# Patient Record
Sex: Female | Born: 2000 | Race: White | Hispanic: No | Marital: Single | Smoking: Never smoker
Health system: Southern US, Community
[De-identification: ages and names within clinical notes are randomized; demographics above are authoritative.]

## PROBLEM LIST (undated history)

## (undated) DIAGNOSIS — N83209 Unspecified ovarian cyst, unspecified side: Secondary | ICD-10-CM

## (undated) DIAGNOSIS — F32A Depression, unspecified: Secondary | ICD-10-CM

## (undated) DIAGNOSIS — F419 Anxiety disorder, unspecified: Secondary | ICD-10-CM

## (undated) DIAGNOSIS — I4891 Unspecified atrial fibrillation: Secondary | ICD-10-CM

## (undated) HISTORY — PX: WISDOM TOOTH EXTRACTION: SHX21

## (undated) HISTORY — DX: Depression, unspecified: F32.A

## (undated) HISTORY — DX: Anxiety disorder, unspecified: F41.9

## (undated) HISTORY — PX: TONSILLECTOMY: SUR1361

## (undated) HISTORY — PX: KNEE SURGERY: SHX244

## (undated) HISTORY — DX: Unspecified ovarian cyst, unspecified side: N83.209

## (undated) HISTORY — PX: OTHER SURGICAL HISTORY: SHX169

## (undated) HISTORY — DX: Unspecified atrial fibrillation: I48.91

## (undated) HISTORY — PX: CHOLECYSTECTOMY, LAPAROSCOPIC: SHX56

---
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
## 2019-01-27 ENCOUNTER — Other Ambulatory Visit (HOSPITAL_COMMUNITY): Payer: Self-pay | Admitting: General Surgery

## 2019-01-27 ENCOUNTER — Other Ambulatory Visit: Payer: Self-pay | Admitting: General Surgery

## 2019-01-27 DIAGNOSIS — R109 Unspecified abdominal pain: Secondary | ICD-10-CM

## 2019-01-27 DIAGNOSIS — R112 Nausea with vomiting, unspecified: Secondary | ICD-10-CM

## 2019-02-01 ENCOUNTER — Other Ambulatory Visit: Payer: Self-pay

## 2019-02-01 ENCOUNTER — Encounter (HOSPITAL_COMMUNITY)
Admission: RE | Admit: 2019-02-01 | Discharge: 2019-02-01 | Disposition: A | Discharge status: Home / Self Care | Payer: Commercial Managed Care - PPO | Source: Ambulatory Visit | Attending: General Surgery | Admitting: General Surgery

## 2019-02-01 DIAGNOSIS — R112 Nausea with vomiting, unspecified: Secondary | ICD-10-CM | POA: Diagnosis not present

## 2019-02-01 MED ORDER — TECHNETIUM TC 99M MEBROFENIN IV KIT
5.3000 | PACK | Freq: Once | INTRAVENOUS | Status: AC | PRN
  Administered 2019-02-01: 5.3 via INTRAVENOUS

## 2019-02-03 ENCOUNTER — Other Ambulatory Visit: Payer: Self-pay

## 2019-02-03 ENCOUNTER — Encounter (HOSPITAL_COMMUNITY): Payer: Self-pay

## 2019-02-03 ENCOUNTER — Encounter (HOSPITAL_COMMUNITY)
Admission: RE | Admit: 2019-02-03 | Discharge: 2019-02-03 | Disposition: A | Discharge status: Home / Self Care | Payer: Commercial Managed Care - PPO | Source: Ambulatory Visit | Attending: General Surgery | Admitting: General Surgery

## 2019-02-03 DIAGNOSIS — R109 Unspecified abdominal pain: Secondary | ICD-10-CM | POA: Insufficient documentation

## 2019-02-03 DIAGNOSIS — R112 Nausea with vomiting, unspecified: Secondary | ICD-10-CM | POA: Insufficient documentation

## 2019-02-03 MED ORDER — TECHNETIUM TC 99M SULFUR COLLOID
2.2000 | Freq: Once | INTRAVENOUS | Status: AC | PRN
  Administered 2019-02-03: 2.2 via ORAL

## 2019-02-05 ENCOUNTER — Other Ambulatory Visit: Payer: Self-pay

## 2019-02-05 ENCOUNTER — Encounter (HOSPITAL_COMMUNITY)
Admission: RE | Admit: 2019-02-05 | Discharge: 2019-02-05 | Disposition: A | Discharge status: Home / Self Care | Payer: Commercial Managed Care - PPO | Source: Ambulatory Visit | Attending: General Surgery | Admitting: General Surgery

## 2019-02-05 DIAGNOSIS — R109 Unspecified abdominal pain: Secondary | ICD-10-CM | POA: Diagnosis not present

## 2019-02-05 DIAGNOSIS — R112 Nausea with vomiting, unspecified: Secondary | ICD-10-CM | POA: Diagnosis present

## 2019-02-05 MED ORDER — TECHNETIUM TC 99M SULFUR COLLOID
2.0000 | Freq: Once | INTRAVENOUS | Status: AC | PRN
  Administered 2019-02-05: 08:00:00 2 via INTRAVENOUS

## 2019-03-12 DIAGNOSIS — K829 Disease of gallbladder, unspecified: Secondary | ICD-10-CM | POA: Insufficient documentation

## 2021-04-10 DIAGNOSIS — T8484XA Pain due to internal orthopedic prosthetic devices, implants and grafts, initial encounter: Secondary | ICD-10-CM | POA: Insufficient documentation

## 2021-06-18 DIAGNOSIS — I4891 Unspecified atrial fibrillation: Secondary | ICD-10-CM | POA: Insufficient documentation

## 2021-09-17 ENCOUNTER — Encounter: Payer: Commercial Managed Care - PPO | Admitting: Obstetrics & Gynecology

## 2021-10-04 ENCOUNTER — Encounter: Payer: Self-pay | Admitting: Obstetrics and Gynecology

## 2021-10-08 ENCOUNTER — Encounter: Payer: Self-pay | Admitting: Obstetrics & Gynecology

## 2021-10-08 ENCOUNTER — Ambulatory Visit (INDEPENDENT_AMBULATORY_CARE_PROVIDER_SITE_OTHER): Payer: Managed Care, Other (non HMO) | Admitting: Obstetrics & Gynecology

## 2021-10-08 ENCOUNTER — Other Ambulatory Visit (HOSPITAL_COMMUNITY)
Admission: RE | Admit: 2021-10-08 | Discharge: 2021-10-08 | Disposition: A | Discharge status: Home / Self Care | Payer: Managed Care, Other (non HMO) | Source: Ambulatory Visit | Attending: Obstetrics & Gynecology | Admitting: Obstetrics & Gynecology

## 2021-10-08 ENCOUNTER — Other Ambulatory Visit: Payer: Self-pay

## 2021-10-08 VITALS — BP 118/68 | HR 74 | Ht 68.0 in | Wt 136.0 lb

## 2021-10-08 DIAGNOSIS — N938 Other specified abnormal uterine and vaginal bleeding: Secondary | ICD-10-CM | POA: Diagnosis not present

## 2021-10-08 DIAGNOSIS — N898 Other specified noninflammatory disorders of vagina: Secondary | ICD-10-CM | POA: Insufficient documentation

## 2021-10-08 DIAGNOSIS — M419 Scoliosis, unspecified: Secondary | ICD-10-CM | POA: Insufficient documentation

## 2021-10-08 DIAGNOSIS — Z3009 Encounter for other general counseling and advice on contraception: Secondary | ICD-10-CM

## 2021-10-08 DIAGNOSIS — T384X5A Adverse effect of oral contraceptives, initial encounter: Secondary | ICD-10-CM

## 2021-10-08 NOTE — Progress Notes (Signed)
   Subjective:    Patient ID: Autumn Howell, female    DOB: [birth year], 20 y.o.   MRN: [id number]  HPI  20 yo female presents c/o 5-6 months vaginal discharge--sour smelling Stopped OCPs in November prior to breast surgery.  Pt was spotting all time time and stopped.  Not sexually right now.  Pt has history of ovarian cysts and had been on a couple of years (maybe generic sprintec).    Review of Systems  Constitutional: Negative.   Respiratory: Negative.    Cardiovascular: Negative.   Gastrointestinal: Negative.   Genitourinary:  Positive for menstrual problem, vaginal bleeding and vaginal discharge. Negative for pelvic pain and vaginal pain.  Musculoskeletal: Negative.       Objective:   Physical Exam Vitals reviewed.  Constitutional:      General: She is not in acute distress.    Appearance: She is well-developed.  HENT:     Head: Normocephalic and atraumatic.  Eyes:     Conjunctiva/sclera: Conjunctivae normal.  Cardiovascular:     Rate and Rhythm: Normal rate.  Pulmonary:     Effort: Pulmonary effort is normal.  Genitourinary:    Comments: Tanner V Vulva:  No lesion Vagina:  Pink, no lesions, white discharge, no blood Cervix:  No CMT Uterus:  Non tender, mobile Right adnexa--non tender, no mass Left adnexa--non tender, no mass Skin:    General: Skin is warm and dry.  Neurological:     Mental Status: She is alert and oriented to person, place, and time.  Psychiatric:        Mood and Affect: Mood normal.   Vitals:   10/08/21 1324  BP: 118/68  Pulse: 74  Weight: 136 lb (61.7 kg)  Height: 5\' 8"  (1.727 m)    Assessment & Plan:  20 yo female with vaginal discharge and history of bleeding on OCPs   GC/Chlam and vaginitis screening Declines serum testing Discussed birth control options, need for OCPs for diagnosis or recurrent ovarian cysts, unintended pregnancy. Needs appt for well woman exam  30 minutes spent during encounter including review of outside  records, history, physical exam, counseling and documentation.

## 2021-10-09 LAB — CERVICOVAGINAL ANCILLARY ONLY
Bacterial Vaginitis (gardnerella): POSITIVE — AB
Candida Glabrata: NEGATIVE
Candida Vaginitis: NEGATIVE
Chlamydia: NEGATIVE
Comment: NEGATIVE
Comment: NEGATIVE
Comment: NEGATIVE
Comment: NEGATIVE
Comment: NEGATIVE
Comment: NORMAL
Neisseria Gonorrhea: NEGATIVE
Trichomonas: NEGATIVE

## 2021-10-11 ENCOUNTER — Other Ambulatory Visit: Payer: Self-pay | Admitting: Obstetrics & Gynecology

## 2021-10-11 MED ORDER — METRONIDAZOLE 500 MG PO TABS: 500.0000 mg | ORAL_TABLET | Freq: Two times a day (BID) | ORAL | 0 refills | Status: AC

## 2021-10-11 NOTE — Progress Notes (Signed)
Flagyl prescribed for BV 

## 2021-10-24 ENCOUNTER — Encounter: Payer: Self-pay | Admitting: *Deleted

## 2022-01-02 ENCOUNTER — Telehealth: Payer: Self-pay | Admitting: *Deleted

## 2022-01-02 NOTE — Telephone Encounter (Signed)
Left patient a message to call the office to schedule annual. ?

## 2022-04-18 ENCOUNTER — Emergency Department (HOSPITAL_BASED_OUTPATIENT_CLINIC_OR_DEPARTMENT_OTHER): Payer: Managed Care, Other (non HMO)

## 2022-04-18 ENCOUNTER — Encounter (HOSPITAL_BASED_OUTPATIENT_CLINIC_OR_DEPARTMENT_OTHER): Payer: Self-pay | Admitting: Emergency Medicine

## 2022-04-18 ENCOUNTER — Other Ambulatory Visit: Payer: Self-pay

## 2022-04-18 ENCOUNTER — Emergency Department (HOSPITAL_BASED_OUTPATIENT_CLINIC_OR_DEPARTMENT_OTHER)
Admission: EM | Admit: 2022-04-18 | Discharge: 2022-04-18 | Disposition: A | Discharge status: Home / Self Care | Payer: Managed Care, Other (non HMO) | Attending: Emergency Medicine | Admitting: Emergency Medicine

## 2022-04-18 DIAGNOSIS — R0602 Shortness of breath: Secondary | ICD-10-CM | POA: Insufficient documentation

## 2022-04-18 DIAGNOSIS — R5383 Other fatigue: Secondary | ICD-10-CM | POA: Insufficient documentation

## 2022-04-18 DIAGNOSIS — R002 Palpitations: Secondary | ICD-10-CM | POA: Insufficient documentation

## 2022-04-18 DIAGNOSIS — Z20822 Contact with and (suspected) exposure to covid-19: Secondary | ICD-10-CM | POA: Diagnosis not present

## 2022-04-18 DIAGNOSIS — R0789 Other chest pain: Secondary | ICD-10-CM | POA: Diagnosis not present

## 2022-04-18 DIAGNOSIS — R072 Precordial pain: Secondary | ICD-10-CM | POA: Diagnosis present

## 2022-04-18 LAB — BASIC METABOLIC PANEL
Anion gap: 8 (ref 5–15)
BUN: 7 mg/dL (ref 6–20)
CO2: 25 mmol/L (ref 22–32)
Calcium: 9.7 mg/dL (ref 8.9–10.3)
Chloride: 105 mmol/L (ref 98–111)
Creatinine, Ser: 0.85 mg/dL (ref 0.44–1.00)
GFR, Estimated: >60 mL/min (ref >60)
Glucose, Bld: 93 mg/dL (ref 70–99)
Potassium: 3.5 mmol/L (ref 3.5–5.1)
Sodium: 138 mmol/L (ref 135–145)

## 2022-04-18 LAB — CBC
HCT: 41.8 % (ref 36.0–46.0)
Hemoglobin: 14.3 g/dL (ref 12.0–15.0)
MCH: 32.1 pg (ref 26.0–34.0)
MCHC: 34.2 g/dL (ref 30.0–36.0)
MCV: 93.9 fL (ref 80.0–100.0)
Platelets: 325 10*3/uL (ref 150–400)
RBC: 4.45 MIL/uL (ref 3.87–5.11)
RDW: 12.5 % (ref 11.5–15.5)
WBC: 8.7 10*3/uL (ref 4.0–10.5)
nRBC: 0 % (ref 0.0–0.2)

## 2022-04-18 LAB — PREGNANCY, URINE: Preg Test, Ur: NEGATIVE

## 2022-04-18 LAB — D-DIMER, QUANTITATIVE: D-Dimer, Quant: <0.27 ug/mL-FEU (ref 0.00–0.50)

## 2022-04-18 LAB — TROPONIN I (HIGH SENSITIVITY)
Troponin I (High Sensitivity): <2 ng/L (ref <18)
Troponin I (High Sensitivity): <2 ng/L (ref <18)

## 2022-04-18 LAB — SARS CORONAVIRUS 2 BY RT PCR: SARS Coronavirus 2 by RT PCR: NEGATIVE

## 2022-04-18 MED ORDER — SODIUM CHLORIDE 0.9 % IV BOLUS
1000.0000 mL | Freq: Once | INTRAVENOUS | Status: AC
  Administered 2022-04-18: 1000 mL via INTRAVENOUS

## 2022-04-18 NOTE — Discharge Instructions (Addendum)
Your history, exam, work-up today was overall reassuring.  No evidence of blood clot with the blood testing and your cardiac enzymes were negative both times we checked them.  Chest x-ray reassuring and your observation.  Did not show any significant arrhythmias or abnormalities.  We feel you are safe for discharge home but please call your cardiology team for follow-up.  If any symptoms change or worsen acutely, please return to the nearest Emergency Department.

## 2022-04-18 NOTE — ED Notes (Signed)
Patient is breathing unlabored. Denies any pain .Report she started having pressure in her chest yesterday and that it was worse today

## 2022-04-18 NOTE — ED Notes (Signed)
Pt with no complaints, family at the bedside, pt currently drinking a tropical smoothie, denies any CP or SOB.

## 2022-04-18 NOTE — ED Triage Notes (Signed)
Pt sent from UC c/o shob and central chest pain that started yesterday. States she feels like she cant catch her breath. Describes pain as "tight". Hx of afib. EKG at [facility] was NSR. Also endorses fatigue and hot flashes.
